# Patient Record
Sex: Female | Born: 1955 | Race: White | Hispanic: No | Marital: Single | Smoking: Never smoker
Health system: Southern US, Community
[De-identification: ages and names within clinical notes are randomized; demographics above are authoritative.]

## PROBLEM LIST (undated history)

## (undated) DIAGNOSIS — F70 Mild intellectual disabilities: Secondary | ICD-10-CM

## (undated) DIAGNOSIS — E119 Type 2 diabetes mellitus without complications: Secondary | ICD-10-CM

## (undated) DIAGNOSIS — I10 Essential (primary) hypertension: Secondary | ICD-10-CM

## (undated) DIAGNOSIS — Z87820 Personal history of traumatic brain injury: Secondary | ICD-10-CM

## (undated) DIAGNOSIS — M1990 Unspecified osteoarthritis, unspecified site: Secondary | ICD-10-CM

## (undated) HISTORY — DX: Essential (primary) hypertension: I10

## (undated) HISTORY — DX: Personal history of traumatic brain injury: Z87.820

## (undated) HISTORY — PX: ABDOMINAL HYSTERECTOMY: SHX81

## (undated) HISTORY — DX: Unspecified osteoarthritis, unspecified site: M19.90

## (undated) HISTORY — PX: KNEE SURGERY: SHX244

## (undated) HISTORY — DX: Mild intellectual disabilities: F70

## (undated) HISTORY — PX: TONSILLECTOMY: SHX5217

## (undated) HISTORY — DX: Type 2 diabetes mellitus without complications: E11.9

---

## 2020-10-21 ENCOUNTER — Other Ambulatory Visit: Payer: Self-pay | Admitting: Family Medicine

## 2020-10-21 DIAGNOSIS — Z1231 Encounter for screening mammogram for malignant neoplasm of breast: Secondary | ICD-10-CM

## 2020-10-29 ENCOUNTER — Ambulatory Visit
Admission: RE | Admit: 2020-10-29 | Discharge: 2020-10-29 | Disposition: A | Discharge status: Home / Self Care | Payer: Medicare HMO | Source: Ambulatory Visit | Attending: Family Medicine | Admitting: Family Medicine

## 2020-10-29 ENCOUNTER — Other Ambulatory Visit: Payer: Self-pay | Admitting: Family Medicine

## 2020-10-29 ENCOUNTER — Other Ambulatory Visit: Payer: Self-pay

## 2020-10-29 DIAGNOSIS — T1490XA Injury, unspecified, initial encounter: Secondary | ICD-10-CM

## 2020-12-28 ENCOUNTER — Ambulatory Visit
Admission: RE | Admit: 2020-12-28 | Discharge: 2020-12-28 | Disposition: A | Discharge status: Home / Self Care | Payer: Medicare HMO | Source: Ambulatory Visit | Attending: Family Medicine | Admitting: Family Medicine

## 2020-12-28 ENCOUNTER — Other Ambulatory Visit: Payer: Self-pay

## 2020-12-28 DIAGNOSIS — Z1231 Encounter for screening mammogram for malignant neoplasm of breast: Secondary | ICD-10-CM

## 2021-03-18 ENCOUNTER — Encounter: Payer: Medicare HMO | Admitting: Obstetrics and Gynecology

## 2021-04-29 ENCOUNTER — Encounter: Payer: Medicare HMO | Admitting: Obstetrics and Gynecology

## 2021-07-14 ENCOUNTER — Ambulatory Visit (INDEPENDENT_AMBULATORY_CARE_PROVIDER_SITE_OTHER): Payer: Medicare HMO | Admitting: Obstetrics and Gynecology

## 2021-07-14 ENCOUNTER — Encounter: Payer: Self-pay | Admitting: Obstetrics and Gynecology

## 2021-07-14 ENCOUNTER — Other Ambulatory Visit: Payer: Self-pay

## 2021-07-14 VITALS — BP 132/68 | HR 72

## 2021-07-14 DIAGNOSIS — Z01419 Encounter for gynecological examination (general) (routine) without abnormal findings: Secondary | ICD-10-CM | POA: Diagnosis not present

## 2021-07-14 DIAGNOSIS — N3946 Mixed incontinence: Secondary | ICD-10-CM

## 2021-07-14 NOTE — Progress Notes (Signed)
65 y.o. No obstetric history on file. Single White or Caucasian Not Hispanic or Latino female here for breast and pelvic exam.      She has mixed incontinence. It has gotten worse in the last year. She has gone from not wearing pads to needing pads.  No LMP recorded.          Sexually active: No.  The current method of family planning is status post hysterectomy.    Exercising: No.  The patient does not participate in regular exercise at present. Smoker:  no  Health Maintenance: Pap:  unsure patient has hysterectomy  History of abnormal Pap:  no MMG:  this year normal per patient done at [facility] will request  BMD:   unsure  Colonoscopy: none  TDaP:  up to date  Gardasil: n/a   reports that she has never smoked. She has never used smokeless tobacco. She reports that she does not currently use alcohol. She reports that she does not use drugs. Lives in a group home.   Past Medical History:  Diagnosis Date   Arthritis    Diabetes mellitus without complication (HCC)    Hypertension    Mild intellectual disabilities    Personal history of traumatic brain injury   Larey Seat of the back porch, when she was child.  She is in a wheelchair, has knee issues and has been falling.   Past Surgical History:  Procedure Laterality Date   ABDOMINAL HYSTERECTOMY     KNEE SURGERY Bilateral    TONSILLECTOMY      Current Outpatient Medications  Medication Sig Dispense Refill   atorvastatin (LIPITOR) 40 MG tablet Take 40 mg by mouth daily.     diazepam (VALIUM) 5 MG tablet Take 5 mg by mouth every 6 (six) hours as needed for anxiety.     gabapentin (NEURONTIN) 100 MG capsule Take 100 mg by mouth 3 (three) times daily.     metFORMIN (GLUCOPHAGE) 500 MG tablet Take 500 mg by mouth 2 (two) times daily.     MYRBETRIQ 25 MG TB24 tablet Take 25 mg by mouth daily.     Promethazine HCl (PHENERGAN PO) Take by mouth.     sertraline (ZOLOFT) 25 MG tablet Take 25 mg by mouth daily.     No current  facility-administered medications for this visit.    Family History  Problem Relation Age of Onset   Breast cancer Maternal Aunt     Review of Systems  All other systems reviewed and are negative.  Exam:   BP 132/68    Pulse 72    SpO2 99%   Weight change: @WEIGHTCHANGE @ Height:      Ht Readings from Last 3 Encounters:  No data found for Ht    General appearance: alert, cooperative and appears stated age Head: Normocephalic, without obvious abnormality, atraumatic Neck: no adenopathy, supple, symmetrical, trachea midline and thyroid normal to inspection and palpation Breasts: normal appearance, no masses or tenderness Abdomen: soft, non-tender; non distended,  no masses,  no organomegaly Extremities: extremities normal, atraumatic, no cyanosis or edema Skin: Skin color, texture, turgor normal. No rashes or lesions Lymph nodes: Cervical, supraclavicular, and axillary nodes normal. No abnormal inguinal nodes palpated Neurologic: Grossly normal   Pelvic: External genitalia:  no lesions              Urethra:  normal appearing urethra with no masses, tenderness or lesions              Bartholins and  Skenes: normal                 Vagina: atrophic appearing vagina, tight at the apex. No lesions              Cervix: absent               Bimanual Exam:  Uterus:  uterus absent              Adnexa: no mass, fullness, tenderness               Rectovaginal: Confirms               Anus:  normal sphincter tone, no lesions  [name] chaperoned for the exam.  1. Encounter for gynecological examination without abnormal finding Mammogram UTD, will get results She needs to schedule colon cancer screening with her primary Labs with primary  2. Mixed incontinence Worsening  - Urinalysis,Complete w/RFL Culture

## 2021-07-15 NOTE — Addendum Note (Signed)
Addended by: Rushie Goltz on: 07/15/2021 03:40 PM   Modules accepted: Orders

## 2021-07-21 ENCOUNTER — Other Ambulatory Visit: Payer: Self-pay | Admitting: Family Medicine

## 2021-07-21 DIAGNOSIS — Z1231 Encounter for screening mammogram for malignant neoplasm of breast: Secondary | ICD-10-CM

## 2021-11-09 ENCOUNTER — Ambulatory Visit (INDEPENDENT_AMBULATORY_CARE_PROVIDER_SITE_OTHER): Payer: Medicare HMO | Admitting: Internal Medicine

## 2021-11-09 ENCOUNTER — Encounter: Payer: Self-pay | Admitting: Internal Medicine

## 2021-11-09 VITALS — BP 120/80 | HR 84 | Ht 66.0 in | Wt 180.4 lb

## 2021-11-09 DIAGNOSIS — E785 Hyperlipidemia, unspecified: Secondary | ICD-10-CM

## 2021-11-09 DIAGNOSIS — R739 Hyperglycemia, unspecified: Secondary | ICD-10-CM

## 2021-11-09 DIAGNOSIS — E119 Type 2 diabetes mellitus without complications: Secondary | ICD-10-CM

## 2021-11-09 LAB — POCT GLYCOSYLATED HEMOGLOBIN (HGB A1C): Hemoglobin A1C: 6.3 % — AB (ref 4.0–5.6)

## 2021-11-09 LAB — LDL CHOLESTEROL, DIRECT: Direct LDL: 56 mg/dL

## 2021-11-09 LAB — LIPID PANEL
Cholesterol: 118 mg/dL (ref 0–200)
HDL: 34.8 mg/dL — ABNORMAL LOW (ref >39.00)
NonHDL: 82.76
Total CHOL/HDL Ratio: 3
Triglycerides: 255 mg/dL — ABNORMAL HIGH (ref 0.0–149.0)
VLDL: 51 mg/dL — ABNORMAL HIGH (ref 0.0–40.0)

## 2021-11-09 LAB — T4, FREE: Free T4: 0.72 ng/dL (ref 0.60–1.60)

## 2021-11-09 LAB — BASIC METABOLIC PANEL
BUN: 20 mg/dL (ref 6–23)
CO2: 29 mEq/L (ref 19–32)
Calcium: 9.4 mg/dL (ref 8.4–10.5)
Chloride: 99 mEq/L (ref 96–112)
Creatinine, Ser: 0.75 mg/dL (ref 0.40–1.20)
GFR: 84.79 mL/min (ref >60.00)
Glucose, Bld: 89 mg/dL (ref 70–99)
Potassium: 4.4 mEq/L (ref 3.5–5.1)
Sodium: 136 mEq/L (ref 135–145)

## 2021-11-09 LAB — TSH: TSH: 1.52 u[IU]/mL (ref 0.35–5.50)

## 2021-11-09 LAB — POCT GLUCOSE (DEVICE FOR HOME USE): Glucose Fasting, POC: 94 mg/dL (ref 70–99)

## 2021-11-09 LAB — MICROALBUMIN / CREATININE URINE RATIO
Creatinine,U: 42.3 mg/dL
Microalb Creat Ratio: 2.1 mg/g (ref 0.0–30.0)
Microalb, Ur: 0.9 mg/dL (ref 0.0–1.9)

## 2021-11-09 MED ORDER — METFORMIN HCL ER 500 MG PO TB24: 500.0000 mg | ORAL_TABLET | Freq: Two times a day (BID) | ORAL | 3 refills | Status: DC

## 2021-11-09 NOTE — Progress Notes (Signed)
?Name: Catherine Levy  ?MRN/ DOB: [id number], 1956-01-20   ?Age/ Sex: 66 y.o., female   ? ?PCP: Pcp, No   ?Reason for Endocrinology Evaluation: Type 2 Diabetes Mellitus  ?   ?Date of Initial Endocrinology Visit: 11/09/2021   ? ? ?PATIENT IDENTIFIER: [name] is a 67 y.o. female with a past medical history of T2DM, HTN, mild intellectual disabilities. The patient presented for initial endocrinology clinic visit on 11/09/2021 for consultative assistance with her diabetes management.  ? ? ?HPI: ?[name] is accompanied by a caretaker from the facility ? ? ?Diagnosed with DM years ago ?Prior Medications tried/Intolerance: metformin  ?Currently checking blood sugars 2 x / day ?Hemoglobin A1c 6.3% on initial presentation to our clinic ?Patient required assistance for hypoglycemia: no ?Patient has required hospitalization within the last 1 year from hyper or hypoglycemia: no ? ?In terms of diet, the patient eats 3 meals a day, snacks graham crackers and sugar-free cookies . Drinks water and diet drinks  ? ? ?HOME DIABETES REGIMEN: ?Metformin 500 mg twice daily ? ? ?Statin: Yes ?ACE-I/ARB: No ? ? ? ?METER DOWNLOAD SUMMARY: not available  ? ? ? ?DIABETIC COMPLICATIONS: ?Microvascular complications:  ?Left eye cataract  ?Denies: CKD , neuropathy , retinopathy  ?Last eye exam: Completed 2022 ? ?Macrovascular complications:  ? ?Denies: CAD, PVD, CVA ? ? ?PAST HISTORY: ?Past Medical History:  ?Past Medical History:  ?Diagnosis Date  ? Arthritis   ? Diabetes mellitus without complication (HCC)   ? Hypertension   ? Mild intellectual disabilities   ? Personal history of traumatic brain injury   ? ?Past Surgical History:  ?Past Surgical History:  ?Procedure Laterality Date  ? ABDOMINAL HYSTERECTOMY    ? KNEE SURGERY Bilateral   ? TONSILLECTOMY    ?  ?Social History:  reports that she has never smoked. She has never used smokeless tobacco. She reports that she does not currently use alcohol. She reports that she does not  use drugs. ?Family History:  ?Family History  ?Problem Relation Age of Onset  ? Breast cancer Maternal Aunt   ? ? ? ?HOME MEDICATIONS: ?Allergies as of 11/09/2021   ?No Known Allergies ?  ? ?  ?Medication List  ?  ? ?  ? Accurate as of November 09, 2021 10:39 AM. If you have any questions, ask your nurse or doctor.  ?  ?  ? ?  ? ?acetaminophen 325 MG tablet ?Commonly known as: TYLENOL ?Take 650 mg by mouth every 6 (six) hours as needed. ?  ?atorvastatin 40 MG tablet ?Commonly known as: LIPITOR ?Take 40 mg by mouth daily. ?  ?Balmex Multi-Purpose Oint ?Apply topically. ?  ?brompheniramine-phenylephrine 2-5 MG/10ML Liqd ?Commonly known as: DIMETAPP ?Take by mouth. ?  ?calcium carbonate 600 MG Tabs tablet ?Commonly known as: OS-CAL ?Take 600 mg by mouth 2 (two) times daily with a meal. ?  ?diazepam 5 MG tablet ?Commonly known as: VALIUM ?Take 5 mg by mouth every 6 (six) hours as needed for anxiety. ?  ?Dulcolax 10 MG suppository ?Generic drug: bisacodyl ?Place 10 mg rectally as needed for moderate constipation. ?  ?gabapentin 100 MG capsule ?Commonly known as: NEURONTIN ?Take 100 mg by mouth 3 (three) times daily. ?  ?lisinopril 20 MG tablet ?Commonly known as: ZESTRIL ?Take 20 mg by mouth daily. ?  ?loperamide 2 MG capsule ?Commonly known as: IMODIUM ?Take 2 mg by mouth as needed for diarrhea or loose stools. ?  ?metFORMIN 500 MG 24 hr tablet ?Commonly  known as: GLUCOPHAGE-XR ?Take 500 mg by mouth daily. ?What changed: Another medication with the same name was removed. Continue taking this medication, and follow the directions you see here. ?Changed by: Scarlette Shorts, MD ?  ?Mintox 200-200-20 MG/5ML suspension ?Generic drug: alum & mag hydroxide-simeth ?Take by mouth every 6 (six) hours as needed for indigestion or heartburn. ?  ?Myrbetriq 25 MG Tb24 tablet ?Generic drug: mirabegron ER ?Take 25 mg by mouth daily. ?  ?Omega-3 1000 MG Caps ?Take 1 capsule by mouth daily. ?  ?PHENERGAN PO ?Take by mouth. ?   ?sertraline 25 MG tablet ?Commonly known as: ZOLOFT ?Take 25 mg by mouth daily. ?  ?vitamin A & D ointment ?Apply 1 application. topically as needed for dry skin. ?  ?vitamin C 1000 MG tablet ?Take 1,000 mg by mouth daily. ?  ?Vitamin D (Cholecalciferol) 25 MCG (1000 UT) Tabs ?Take 2,000 Units by mouth daily. ?  ?ZyrTEC Allergy 10 MG Caps ?Generic drug: Cetirizine HCl ?Take 10 mg by mouth daily at 6 (six) AM. ?  ? ?  ? ? ? ?ALLERGIES: ?No Known Allergies ? ? ?REVIEW OF SYSTEMS: ?A comprehensive ROS was conducted with the patient and is negative except as per HPI and below:  ?Review of Systems  ?Eyes:  Negative for blurred vision.  ?Gastrointestinal:  Negative for diarrhea, nausea and vomiting.  ? ?  ?OBJECTIVE:  ? ?VITAL SIGNS: BP 120/80 (BP Location: Left Arm, Patient Position: Sitting, Cuff Size: Small)   Pulse 84   Ht  (1.676 m)   Wt 180 lb 6.4 oz (81.8 kg)   SpO2 94%   BMI 29.12 kg/m?   ? ?PHYSICAL EXAM:  ?General: Pt appears well and is in NAD  ?Neck: General: Supple without adenopathy or carotid bruits. ?Thyroid: Thyroid size normal.  No goiter or nodules appreciated. No thyroid bruit.  ?Lungs: Clear with good BS bilat with no rales, rhonchi, or wheezes  ?Heart: RRR with normal S1 and S2 and no gallops; no murmurs; no rub  ?Extremities:  ?Lower extremities - No pretibial edema, patient with bilateral knee flexion deformity  ?Neuro: MS is good with appropriate affect, pt is alert and Ox3  ? ? ?DM foot exam: 11/09/2021 ? ?The skin of the feet is intact without sores or ulcerations. ?The pedal pulses are 2+ on right and 2+ on left. ?The sensation is intact to a screening 5.07, 10 gram monofilament bilaterally ? ? ?DATA REVIEWED: ? ?Lab Results  ?Component Value Date  ? HGBA1C 6.3 (A) 11/09/2021  ? ? Latest Reference Range & Units 11/09/21 11:15  ?Sodium 135 - 145 mEq/L 136  ?Potassium 3.5 - 5.1 mEq/L 4.4  ?Chloride 96 - 112 mEq/L 99  ?CO2 19 - 32 mEq/L 29  ?Glucose 70 - 99 mg/dL 89  ?BUN 6 - 23 mg/dL 20   ?Creatinine 0.40 - 1.20 mg/dL 1.61  ?Calcium 8.4 - 10.5 mg/dL 9.4  ?GFR >60.00 mL/min 84.79  ?Total CHOL/HDL Ratio  3  ?Cholesterol 0 - 200 mg/dL 096  ?HDL Cholesterol >39.00 mg/dL 04.54 (L)  ?Direct LDL mg/dL 09.8  ?MICROALB/CREAT RATIO 0.0 - 30.0 mg/g 2.1  ?NonHDL  82.76  ?Triglycerides 0.0 - 149.0 mg/dL 119.1 (H)  ?VLDL 0.0 - 40.0 mg/dL 47.8 (H)  ?TSH 0.35 - 5.50 uIU/mL 1.52  ?T4,Free(Direct) 0.60 - 1.60 ng/dL 2.95  ? ? ?ASSESSMENT / PLAN / RECOMMENDATIONS:  ? ?1) Type 2 Diabetes Mellitus, optimally controlled, With out complications - Most recent A1c of 6.3%.  Goal A1c <7.0%.   ? 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  The patient does sometimes consume sugar sweetened products at the facility ?-I understand that this is difficult to control in a group home but I have asked the patient to be mindful as much as possible with her eating habits, for example today they have a birthday celebration and they will be serving ice cream, cupcakes, and pizza ?-Caregiver is asking what they should do if the patient has hyperglycemia >180 mg/dL I explained to her that since the patient is not on insulin the only thing that can be done is to hydrate as much as possible and avoid eating until the glucose readings trend down usually within 3-4 hours ?-No changes at this time to her metformin ? ?MEDICATIONS: ?Continue metformin 500 mg twice daily ? ?EDUCATION / INSTRUCTIONS: ?BG monitoring instructions: Patient is instructed to check her blood sugars 2-3 times a week ? ?2) Diabetic complications:  ?Eye: Does not have known diabetic retinopathy.  ?Neuro/ Feet: Does not have known diabetic peripheral neuropathy. ?Renal: Patient does not have known baseline CKD. She is not on an ACEI/ARB at present. ? ? ?3)Dyslipidemia: ? ?-LDL at goal but TG is elevated ?-We will continue to monitor,  no change ? ?Continue atorvastatin 40 mg daily ? ? ? ?Signed electronically by: ?Abby Nena Jordan, MD ? ?[facility]  ?[facility] ?[address] ?[place], [place] [address]

## 2021-11-09 NOTE — Patient Instructions (Signed)
Continue Metformin 500 mg twice daily.

## 2021-11-10 DIAGNOSIS — E785 Hyperlipidemia, unspecified: Secondary | ICD-10-CM | POA: Insufficient documentation

## 2021-11-10 DIAGNOSIS — E119 Type 2 diabetes mellitus without complications: Secondary | ICD-10-CM | POA: Insufficient documentation

## 2022-01-03 ENCOUNTER — Encounter: Payer: Self-pay | Admitting: Radiology

## 2022-01-03 ENCOUNTER — Ambulatory Visit
Admission: RE | Admit: 2022-01-03 | Discharge: 2022-01-03 | Disposition: A | Discharge status: Home / Self Care | Payer: Medicare HMO | Source: Ambulatory Visit | Attending: Family Medicine | Admitting: Family Medicine

## 2022-01-03 DIAGNOSIS — Z1231 Encounter for screening mammogram for malignant neoplasm of breast: Secondary | ICD-10-CM

## 2022-05-09 ENCOUNTER — Ambulatory Visit: Payer: Medicare HMO | Admitting: Internal Medicine

## 2022-05-09 NOTE — Progress Notes (Deleted)
Name: Catherine Levy  MRN/ DOB: [id number], 01/09/1956   Age/ Sex: 66 y.o., female    PCP: Trinidad Curet   Reason for Endocrinology Evaluation: Type 2 Diabetes Mellitus     Date of Initial Endocrinology Visit: 11/09/2021    PATIENT IDENTIFIER: [name] is a 66 y.o. female with a past medical history of T2DM, HTN, mild intellectual disabilities. The patient presented for initial endocrinology clinic visit on 11/09/2021  for consultative assistance with her diabetes management.    HPI: [name] is accompanied by a caretaker from the facility   Diagnosed with DM years ago Prior Medications tried/Intolerance: metformin  Hemoglobin A1c 6.3% on initial presentation to our clinic  On her initial visit to our clinic her A1c was 6.3%, we continued metformin   SUBJECTIVE:   During the last visit (11/09/2021): A1c 6.3%  Today (05/09/22): Catherine Levy is here for follow-up on diabetes management.  She checks her blood sugars *** times daily. The patient has *** had hypoglycemic episodes since the last clinic visit, which typically occur *** x / - most often occuring ***. The patient is *** symptomatic with these episodes, with symptoms of {symptoms; hypoglycemia:[id number]}.      HOME DIABETES REGIMEN: Metformin 500 mg twice daily   Statin: Yes ACE-I/ARB: No    METER DOWNLOAD SUMMARY: not available     DIABETIC COMPLICATIONS: Microvascular complications:  Left eye cataract  Denies: CKD , neuropathy , retinopathy  Last eye exam: Completed 2022  Macrovascular complications:   Denies: CAD, PVD, CVA   PAST HISTORY: Past Medical History:  Past Medical History:  Diagnosis Date   Arthritis    Diabetes mellitus without complication ([place])    Hypertension    Mild intellectual disabilities    Personal history of traumatic brain injury    Past Surgical History:  Past Surgical History:  Procedure Laterality Date   ABDOMINAL HYSTERECTOMY     KNEE SURGERY  Bilateral    TONSILLECTOMY      Social History:  reports that she has never smoked. She has never used smokeless tobacco. She reports that she does not currently use alcohol. She reports that she does not use drugs. Family History:  Family History  Problem Relation Age of Onset   Breast cancer Maternal Aunt      HOME MEDICATIONS: Allergies as of 05/09/2022   No Known Allergies      Medication List        Accurate as of May 09, 2022  7:27 AM. If you have any questions, ask your nurse or doctor.          acetaminophen 325 MG tablet Commonly known as: TYLENOL Take 650 mg by mouth every 6 (six) hours as needed.   atorvastatin 40 MG tablet Commonly known as: LIPITOR Take 40 mg by mouth daily.   Balmex Multi-Purpose Oint Apply topically.   BENADRYL CHILDRENS ALLERGY 12.5 MG/5ML liquid Generic drug: diphenhydrAMINE Take by mouth 4 (four) times daily as needed.   brompheniramine-phenylephrine 2-5 MG/10ML Liqd Commonly known as: DIMETAPP Take by mouth.   calcium carbonate 600 MG Tabs tablet Commonly known as: OS-CAL Take 600 mg by mouth 2 (two) times daily with a meal.   carbamide peroxide 6.5 % OTIC solution Commonly known as: DEBROX 5 drops 2 (two) times daily.   diazepam 5 MG tablet Commonly known as: VALIUM Take 5 mg by mouth every 6 (six) hours as needed for anxiety.   Dulcolax 10 MG suppository Generic drug: bisacodyl Place  10 mg rectally as needed for moderate constipation.   gabapentin 100 MG capsule Commonly known as: NEURONTIN Take 100 mg by mouth 3 (three) times daily.   ibuprofen 200 MG tablet Commonly known as: ADVIL Take 200 mg by mouth every 6 (six) hours as needed.   lisinopril 20 MG tablet Commonly known as: ZESTRIL Take 20 mg by mouth daily.   loperamide 2 MG capsule Commonly known as: IMODIUM Take 2 mg by mouth as needed for diarrhea or loose stools.   metFORMIN 500 MG 24 hr tablet Commonly known as: GLUCOPHAGE-XR Take 1  tablet (500 mg total) by mouth in the morning and at bedtime.   Milk of Magnesia 400 MG/5ML suspension Generic drug: magnesium hydroxide Take by mouth daily as needed for mild constipation.   Mintox 200-200-20 MG/5ML suspension Generic drug: alum & mag hydroxide-simeth Take by mouth every 6 (six) hours as needed for indigestion or heartburn.   Myrbetriq 25 MG Tb24 tablet Generic drug: mirabegron ER Take 25 mg by mouth daily.   neomycin-bacitracin-polymyxin [phone] ointment Apply topically 4 (four) times daily.   Omega-3 1000 MG Caps Take 1 capsule by mouth daily.   PHENERGAN PO Take 25 mg by mouth.   sertraline 25 MG tablet Commonly known as: ZOLOFT Take 25 mg by mouth daily.   Sunscreen Ultra Sheer Lotn Apply topically.   vitamin A & D ointment Apply 1 application. topically as needed for dry skin.   vitamin C 1000 MG tablet Take 1,000 mg by mouth daily.   Vitamin D (Cholecalciferol) 25 MCG (1000 UT) Tabs Take 2,000 Units by mouth daily.   ZyrTEC Allergy 10 MG Caps Generic drug: Cetirizine HCl Take 10 mg by mouth daily at 6 (six) AM.         ALLERGIES: No Known Allergies     OBJECTIVE:   VITAL SIGNS: There were no vitals taken for this visit.   PHYSICAL EXAM:  General: Pt appears well and is in NAD  Neck: General: Supple without adenopathy or carotid bruits. Thyroid: Thyroid size normal.  No goiter or nodules appreciated. No thyroid bruit.  Lungs: Clear with good BS bilat with no rales, rhonchi, or wheezes  Heart: RRR with normal S1 and S2 and no gallops; no murmurs; no rub  Extremities:  Lower extremities - No pretibial edema, patient with bilateral knee flexion deformity  Neuro: MS is good with appropriate affect, pt is alert and Ox3    DM foot exam: 11/09/2021  The skin of the feet is intact without sores or ulcerations. The pedal pulses are 2+ on right and 2+ on left. The sensation is intact to a screening 5.07, 10 gram monofilament  bilaterally   DATA REVIEWED:  Lab Results  Component Value Date   HGBA1C 6.3 (A) 11/09/2021    Latest Reference Range & Units 11/09/21 11:15  Sodium 135 - 145 mEq/L 136  Potassium 3.5 - 5.1 mEq/L 4.4  Chloride 96 - 112 mEq/L 99  CO2 19 - 32 mEq/L 29  Glucose 70 - 99 mg/dL 89  BUN 6 - 23 mg/dL 20  Creatinine 0.40 - 1.20 mg/dL 0.75  Calcium 8.4 - 10.5 mg/dL 9.4  GFR >60.00 mL/min 84.79  Total CHOL/HDL Ratio  3  Cholesterol 0 - 200 mg/dL 118  HDL Cholesterol >39.00 mg/dL 34.80 (L)  Direct LDL mg/dL 56.0  MICROALB/CREAT RATIO 0.0 - 30.0 mg/g 2.1  NonHDL  82.76  Triglycerides 0.0 - 149.0 mg/dL 255.0 (H)  VLDL 0.0 - 40.0 mg/dL  51.0 (H)  TSH 0.35 - 5.50 uIU/mL 1.52  T4,Free(Direct) 0.60 - 1.60 ng/dL 0.72    ASSESSMENT / PLAN / RECOMMENDATIONS:   1) Type 2 Diabetes Mellitus, optimally controlled, With out complications - Most recent A1c of 6.3%. Goal A1c <7.0%.    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  The patient does sometimes consume sugar sweetened products at the facility -I understand that this is difficult to control in a group home but I have asked the patient to be mindful as much as possible with her eating habits, for example today they have a birthday celebration and they will be serving ice cream, cupcakes, and pizza -Caregiver is asking what they should do if the patient has hyperglycemia >180 mg/dL I explained to her that since the patient is not on insulin the only thing that can be done is to hydrate as much as possible and avoid eating until the glucose readings trend down usually within 3-4 hours -No changes at this time to her metformin  MEDICATIONS: Continue metformin 500 mg twice daily  EDUCATION / INSTRUCTIONS: BG monitoring instructions: Patient is instructed to check her blood sugars 2-3 times a week  2)  Diabetic complications:  Eye: Does not have known diabetic retinopathy.  Neuro/ Feet: Does not have known diabetic peripheral neuropathy. Renal: Patient does not have known baseline CKD. She is not on an ACEI/ARB at present.   3)Dyslipidemia:  -LDL at goal but TG is elevated -We will continue to monitor, no change  Continue atorvastatin 40 mg daily    Signed electronically by: Mack Guise, MD  [facility]  [facility] [address] [place], [place] [address] Phone: [phone] FAX: [phone]   CC: Trinidad Curet [address] [place] [address] Phone: [phone]  Fax: [phone]    Return to Endocrinology clinic as below: Future Appointments  Date Time Provider [place]  05/09/2022 10:10 AM Rosalind Guido, Melanie Crazier, MD [id number] None

## 2022-08-19 ENCOUNTER — Other Ambulatory Visit: Payer: Self-pay

## 2022-08-19 DIAGNOSIS — E119 Type 2 diabetes mellitus without complications: Secondary | ICD-10-CM

## 2022-08-19 MED ORDER — METFORMIN HCL ER 500 MG PO TB24: 500.0000 mg | ORAL_TABLET | Freq: Two times a day (BID) | ORAL | 0 refills | Status: AC

## 2022-08-23 NOTE — Progress Notes (Signed)
67 y.o. G0P0000 Single White or Caucasian Not Hispanic or Latino female here for annual exam.  H/O hysterectomy. No vaginal bleeding. She has some incontinence at night. Not getting worse.  No bowel c/o.     No LMP recorded. Patient is postmenopausal.          Sexually active: No.  The current method of family planning is post menopausal status.    Exercising: Yes.     Tries to walk  Smoker:  no  Health Maintenance: Pap:  patient is unsure she had had a hysterectomy  History of abnormal Pap:  no MMG:  01/04/22 density B Bi-rads 1 neg  BMD:   none  Colonoscopy: years ago  TDaP:  unsure, will check with her primary.  Gardasil: n/a   reports that she has never smoked. She has never used smokeless tobacco. She reports that she does not currently use alcohol. She reports that she does not use drugs. Lives in a group home.   Past Medical History:  Diagnosis Date   Arthritis    Diabetes mellitus without complication ([place])    Hypertension    Mild intellectual disabilities    Personal history of traumatic brain injury     Past Surgical History:  Procedure Laterality Date   ABDOMINAL HYSTERECTOMY     KNEE SURGERY Bilateral    TONSILLECTOMY      Current Outpatient Medications  Medication Sig Dispense Refill   acetaminophen (TYLENOL) 325 MG tablet Take 650 mg by mouth every 6 (six) hours as needed.     alum & mag hydroxide-simeth ([place]men[place]) 200-200-20 MG/5ML suspension Take by mouth every 6 (six) hours as needed for indigestion or heartburn.     Ascorbic Acid (VITAMIN C) 1000 MG tablet Take 1,000 mg by mouth daily.     atorvastatin (LIPITOR) 40 MG tablet Take 40 mg by mouth daily.     bisacodyl (DULCOLAX) 10 MG suppository Place 10 mg rectally as needed for moderate constipation.     brompheniramine-phenylephrine (DIMETAPP) 2-5 MG/10ML LIQD Take by mouth.     calcium carbonate (OS-CAL) 600 MG TABS tablet Take 600 mg by mouth 2 (two) times daily with a meal.     carbamide peroxide  (DEBROX) 6.5 % OTIC solution 5 drops 2 (two) times daily.     Cetirizine HCl (ZYRTEC ALLERGY) 10 MG CAPS Take 10 mg by mouth daily at 6 (six) AM.     Diaper Rash Products (BALMEX MULTI-PURPOSE) OINT Apply topically.     diazepam (VALIUM) 5 MG tablet Take 5 mg by mouth every 6 (six) hours as needed for anxiety.     diphenhydrAMINE (BENADRYL CHILDRENS ALLERGY) 12.5 MG/5ML liquid Take by mouth 4 (four) times daily as needed.     gabapentin (NEURONTIN) 100 MG capsule Take 100 mg by mouth 3 (three) times daily.     ibuprofen (ADVIL) 200 MG tablet Take 200 mg by mouth every 6 (six) hours as needed.     lisinopril (ZESTRIL) 20 MG tablet Take 20 mg by mouth daily.     loperamide (IMODIUM) 2 MG capsule Take 2 mg by mouth as needed for diarrhea or loose stools.     magnesium hydroxide (MILK OF MAGNESIA) 400 MG/5ML suspension Take by mouth daily as needed for mild constipation.     metFORMIN (GLUCOPHAGE-XR) 500 MG 24 hr tablet Take 1 tablet (500 mg total) by mouth in the morning and at bedtime. 60 tablet 0   MYRBETRIQ 25 MG TB24 tablet Take 25 mg  by mouth daily.     neomycin-bacitracin-polymyxin (NEOSPORIN) [phone] ointment Apply topically 4 (four) times daily.     Omega-3 1000 MG CAPS Take 1 capsule by mouth daily.     Promethazine HCl (PHENERGAN PO) Take 25 mg by mouth.     sertraline (ZOLOFT) 25 MG tablet Take 25 mg by mouth daily.     Sunscreens (SUNSCREEN ULTRA SHEER) LOTN Apply topically.     Vitamin D, Cholecalciferol, 25 MCG (1000 UT) TABS Take 2,000 Units by mouth daily.     Vitamins A & D (VITAMIN A & D) ointment Apply 1 application. topically as needed for dry skin.     No current facility-administered medications for this visit.    Family History  Problem Relation Age of Onset   Breast cancer Maternal Aunt     Review of Systems  All other systems reviewed and are negative.   Exam:   There were no vitals taken for this visit.  Weight change: @WEIGHTCHANGE$ @ Height:      Ht  Readings from Last 3 Encounters:  11/09/21 5' 6"$  (1.676 m)    General appearance: alert, cooperative and appears stated age Head: Normocephalic, without obvious abnormality, atraumatic Neck: no adenopathy, supple, symmetrical, trachea midline and thyroid normal to inspection and palpation Breasts: normal appearance, no masses or tenderness Abdomen: soft, non-tender; non distended,  no masses,  no organomegaly Extremities: extremities normal, atraumatic, no cyanosis or edema Skin: Skin color, texture, turgor normal. No rashes or lesions Lymph nodes: Cervical, supraclavicular, and axillary nodes normal. No abnormal inguinal nodes palpated Neurologic: Grossly normal   Pelvic: External genitalia:  no lesions              Urethra:  normal appearing urethra with no masses, tenderness or lesions              Bartholins and Skenes: normal                 Vagina: atrophic appearing vagina, very narrow at the apex              Cervix: absent               Bimanual Exam:  Uterus:  uterus absent              Adnexa: no mass, fullness, tenderness               Rectovaginal: Confirms               Anus:  normal sphincter tone, no lesions  Kimalexis, RMA chaperoned for the exam.  1. Encounter for breast and pelvic examination No pap needed Mammogram due in 6/24 Labs with primary She will discuss colonoscopy with her primary

## 2022-08-30 ENCOUNTER — Ambulatory Visit (INDEPENDENT_AMBULATORY_CARE_PROVIDER_SITE_OTHER): Payer: Medicare HMO | Admitting: Obstetrics and Gynecology

## 2022-08-30 ENCOUNTER — Encounter: Payer: Self-pay | Admitting: Obstetrics and Gynecology

## 2022-08-30 VITALS — BP 120/82 | HR 70

## 2022-08-30 DIAGNOSIS — Z01419 Encounter for gynecological examination (general) (routine) without abnormal findings: Secondary | ICD-10-CM

## 2022-11-24 ENCOUNTER — Other Ambulatory Visit: Payer: Self-pay | Admitting: Family Medicine

## 2022-11-24 DIAGNOSIS — Z1231 Encounter for screening mammogram for malignant neoplasm of breast: Secondary | ICD-10-CM

## 2022-12-25 IMAGING — MG MM DIGITAL SCREENING BILAT W/ TOMO AND CAD
8 series · 8 of 24 positions shown · non-contrast
Comparison: None.

CLINICAL DATA: Screening.

EXAM:
DIGITAL SCREENING BILATERAL MAMMOGRAM WITH TOMOSYNTHESIS AND CAD
TECHNIQUE: Bilateral screening digital craniocaudal and mediolateral oblique
mammograms were obtained. Bilateral screening digital breast
tomosynthesis was performed. The images were evaluated with
computer-aided detection.

[L MLO synth-2D]
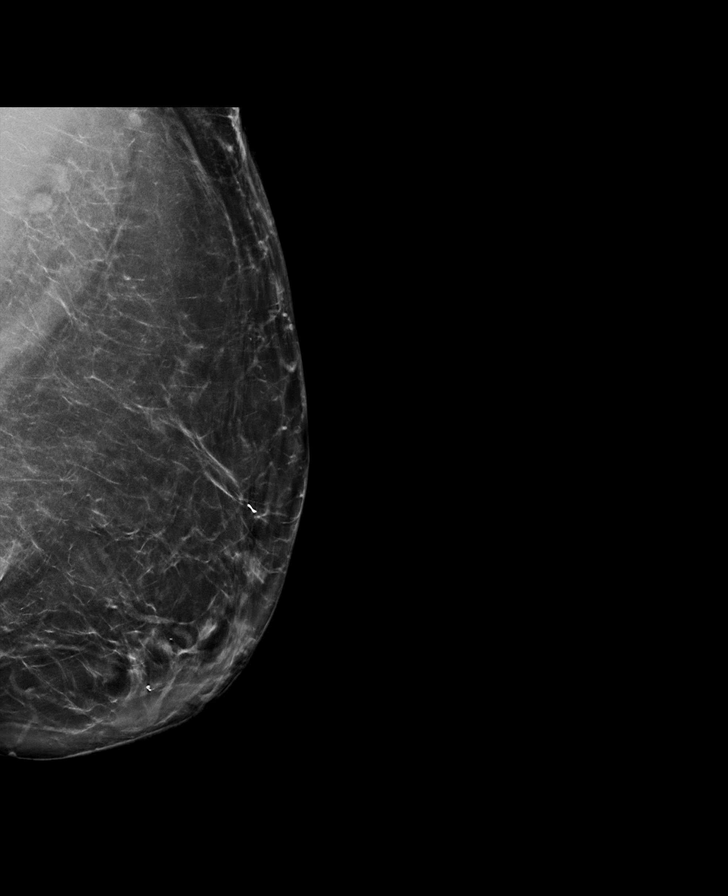

[L CC synth-2D]
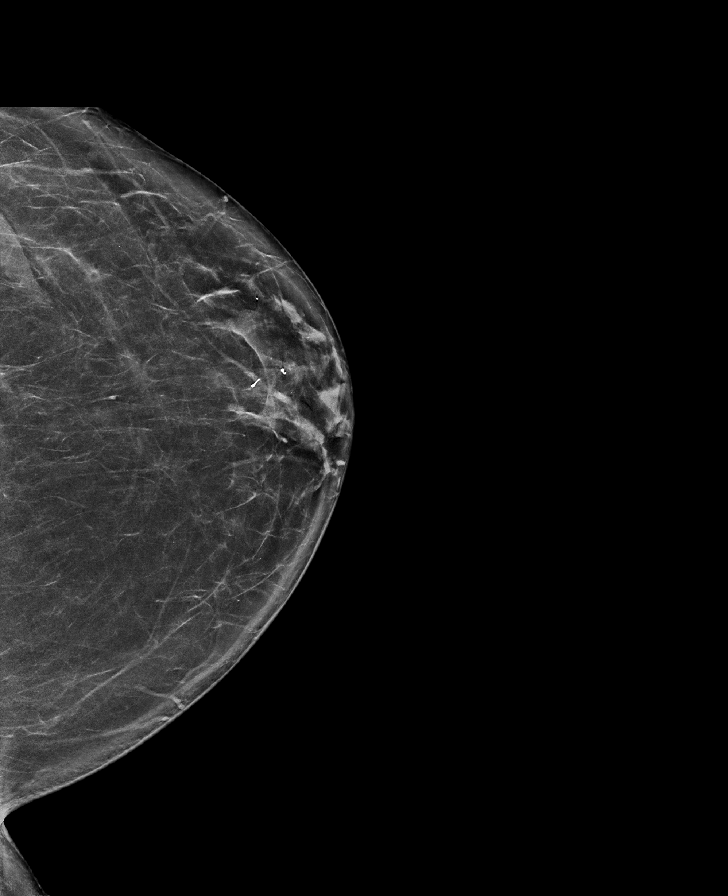

[R MLO synth-2D]
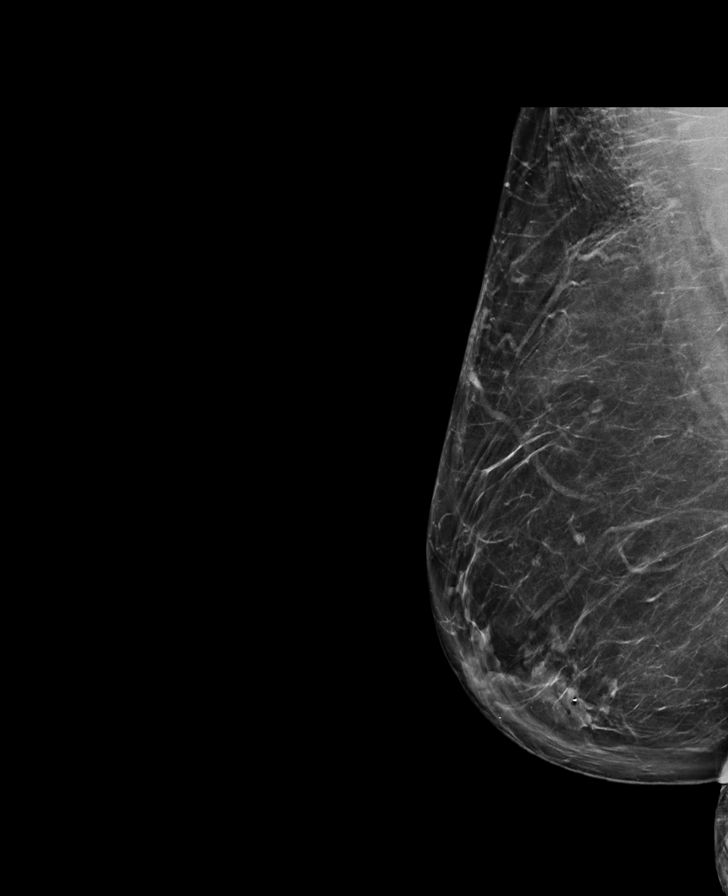

[R CC synth-2D]
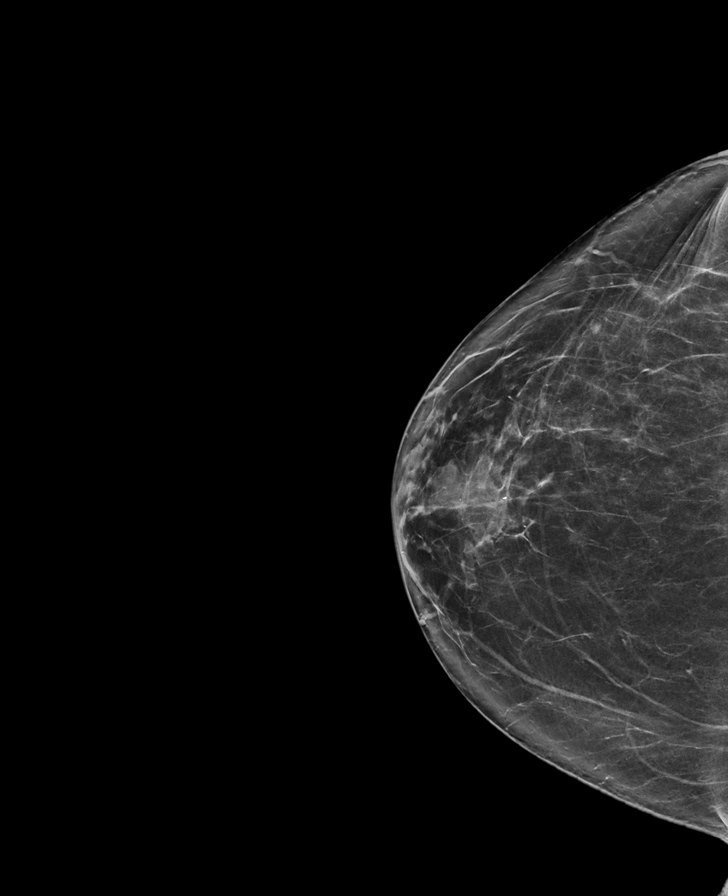

[R MLO tomo · tomo slice 40/79.0]
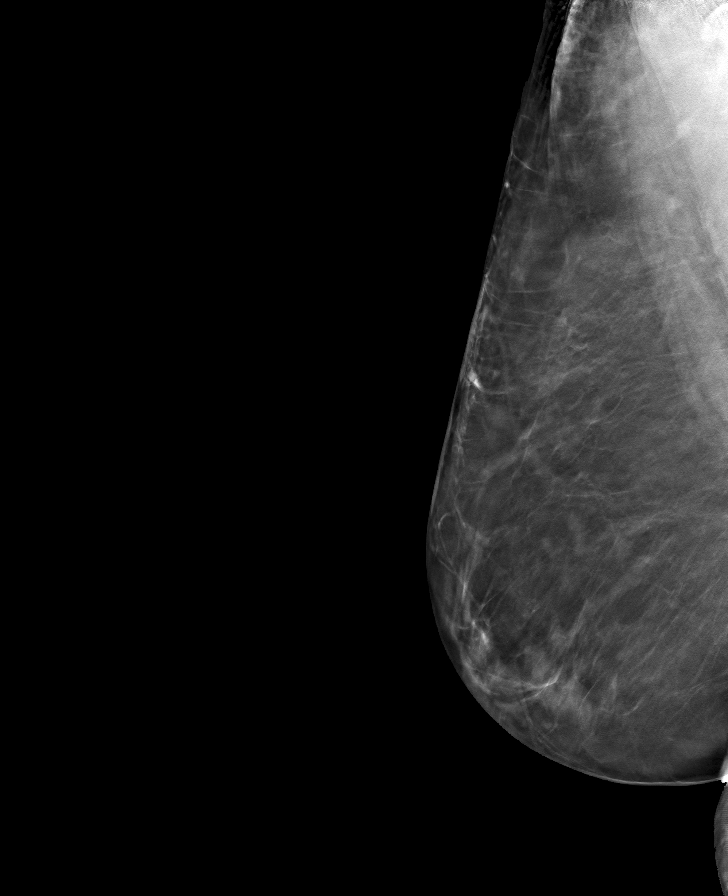

[L MLO tomo · tomo slice 44/87.0]
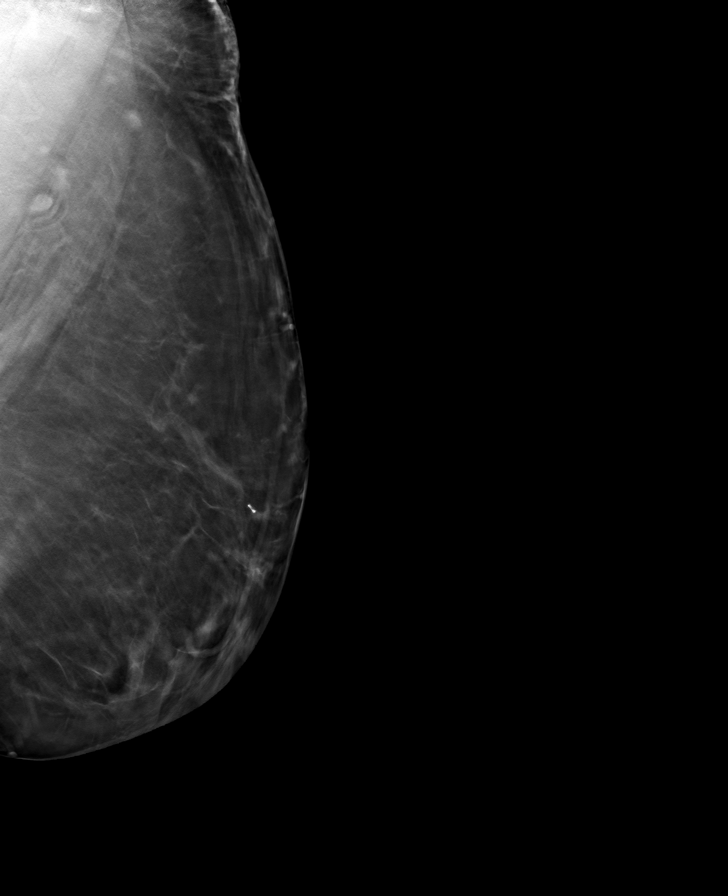

[R CC tomo · tomo slice 37/72.0]
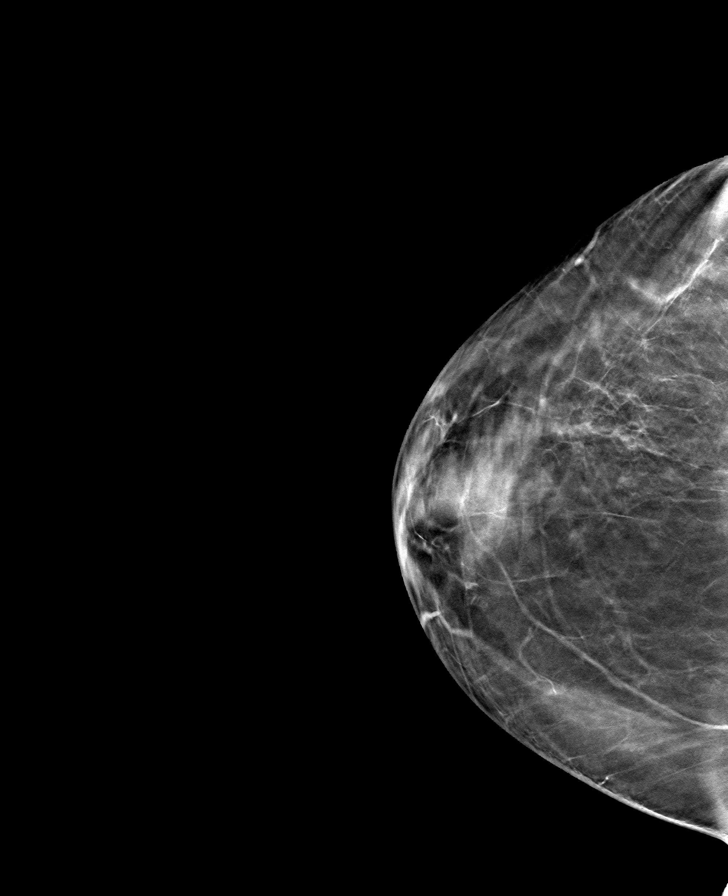

[L CC tomo · tomo slice 38/75.0]
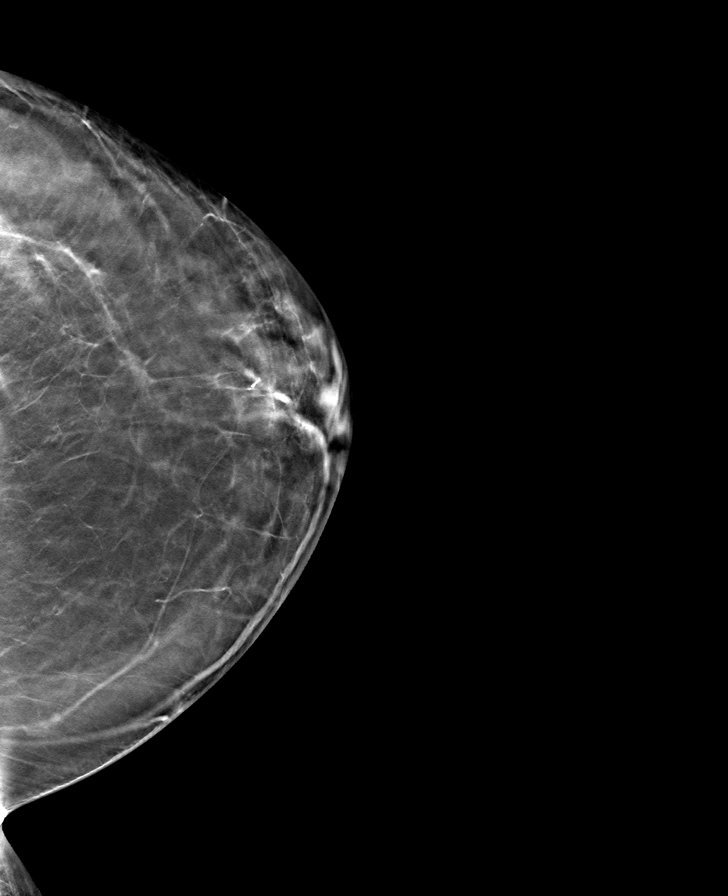

[8 of 24 positions shown; findings below may reference images not displayed]

ACR Breast Density Category b: There are scattered areas of
fibroglandular density.
FINDINGS: There are no findings suspicious for malignancy. The images were
evaluated with computer-aided detection.
IMPRESSION: No mammographic evidence of malignancy. A result letter of this
screening mammogram will be mailed directly to the patient.

RECOMMENDATION:
Screening mammogram in one year. (Code:C7-6-ASJ)

BI-RADS CATEGORY  1: Negative.

## 2023-01-05 ENCOUNTER — Ambulatory Visit
Admission: RE | Admit: 2023-01-05 | Discharge: 2023-01-05 | Disposition: A | Discharge status: Home / Self Care | Payer: Medicare HMO | Source: Ambulatory Visit | Attending: Family Medicine | Admitting: Family Medicine

## 2023-01-05 DIAGNOSIS — Z1231 Encounter for screening mammogram for malignant neoplasm of breast: Secondary | ICD-10-CM

## 2023-06-12 ENCOUNTER — Other Ambulatory Visit (HOSPITAL_COMMUNITY): Payer: Self-pay | Admitting: Family Medicine

## 2023-06-12 DIAGNOSIS — M79672 Pain in left foot: Secondary | ICD-10-CM

## 2024-01-02 ENCOUNTER — Emergency Department (HOSPITAL_COMMUNITY)
Admission: EM | Admit: 2024-01-02 | Discharge: 2024-01-02 | Disposition: A | Discharge status: Home / Self Care | Attending: Emergency Medicine | Admitting: Emergency Medicine

## 2024-01-02 ENCOUNTER — Other Ambulatory Visit: Payer: Self-pay

## 2024-01-02 ENCOUNTER — Emergency Department (HOSPITAL_COMMUNITY)

## 2024-01-02 ENCOUNTER — Encounter (HOSPITAL_COMMUNITY): Payer: Self-pay

## 2024-01-02 DIAGNOSIS — R112 Nausea with vomiting, unspecified: Secondary | ICD-10-CM | POA: Diagnosis present

## 2024-01-02 DIAGNOSIS — I10 Essential (primary) hypertension: Secondary | ICD-10-CM | POA: Insufficient documentation

## 2024-01-02 DIAGNOSIS — Z79899 Other long term (current) drug therapy: Secondary | ICD-10-CM | POA: Insufficient documentation

## 2024-01-02 DIAGNOSIS — E119 Type 2 diabetes mellitus without complications: Secondary | ICD-10-CM | POA: Insufficient documentation

## 2024-01-02 DIAGNOSIS — E860 Dehydration: Secondary | ICD-10-CM | POA: Insufficient documentation

## 2024-01-02 DIAGNOSIS — Z7984 Long term (current) use of oral hypoglycemic drugs: Secondary | ICD-10-CM | POA: Insufficient documentation

## 2024-01-02 DIAGNOSIS — I959 Hypotension, unspecified: Secondary | ICD-10-CM | POA: Insufficient documentation

## 2024-01-02 DIAGNOSIS — N3000 Acute cystitis without hematuria: Secondary | ICD-10-CM | POA: Insufficient documentation

## 2024-01-02 LAB — URINALYSIS, ROUTINE W REFLEX MICROSCOPIC
Bilirubin Urine: NEGATIVE
Glucose, UA: NEGATIVE mg/dL
Hgb urine dipstick: NEGATIVE
Ketones, ur: NEGATIVE mg/dL
Nitrite: NEGATIVE
Protein, ur: NEGATIVE mg/dL
Specific Gravity, Urine: 1.012 (ref 1.005–1.030)
WBC, UA: >50 WBC/hpf (ref 0–5)
pH: 7 (ref 5.0–8.0)

## 2024-01-02 LAB — CBC WITH DIFFERENTIAL/PLATELET
Abs Immature Granulocytes: 0.02 10*3/uL (ref 0.00–0.07)
Basophils Absolute: 0 10*3/uL (ref 0.0–0.1)
Basophils Relative: 0 %
Eosinophils Absolute: 0.1 10*3/uL (ref 0.0–0.5)
Eosinophils Relative: 1 %
HCT: 38.3 % (ref 36.0–46.0)
Hemoglobin: 12.1 g/dL (ref 12.0–15.0)
Immature Granulocytes: 0 %
Lymphocytes Relative: 16 %
Lymphs Abs: 1.5 10*3/uL (ref 0.7–4.0)
MCH: 26.9 pg (ref 26.0–34.0)
MCHC: 31.6 g/dL (ref 30.0–36.0)
MCV: 85.3 fL (ref 80.0–100.0)
Monocytes Absolute: 0.6 10*3/uL (ref 0.1–1.0)
Monocytes Relative: 6 %
Neutro Abs: 7.2 10*3/uL (ref 1.7–7.7)
Neutrophils Relative %: 77 %
Platelets: 266 10*3/uL (ref 150–400)
RBC: 4.49 MIL/uL (ref 3.87–5.11)
RDW: 12.6 % (ref 11.5–15.5)
WBC: 9.4 10*3/uL (ref 4.0–10.5)
nRBC: 0 % (ref 0.0–0.2)

## 2024-01-02 LAB — COMPREHENSIVE METABOLIC PANEL WITH GFR
ALT: 20 U/L (ref 0–44)
AST: 21 U/L (ref 15–41)
Albumin: 3.7 g/dL (ref 3.5–5.0)
Alkaline Phosphatase: 54 U/L (ref 38–126)
Anion gap: 11 (ref 5–15)
BUN: 19 mg/dL (ref 8–23)
CO2: 26 mmol/L (ref 22–32)
Calcium: 9.1 mg/dL (ref 8.9–10.3)
Chloride: 97 mmol/L — ABNORMAL LOW (ref 98–111)
Creatinine, Ser: 0.9 mg/dL (ref 0.44–1.00)
GFR, Estimated: >60 mL/min (ref >60)
Glucose, Bld: 109 mg/dL — ABNORMAL HIGH (ref 70–99)
Potassium: 4.1 mmol/L (ref 3.5–5.1)
Sodium: 134 mmol/L — ABNORMAL LOW (ref 135–145)
Total Bilirubin: 0.7 mg/dL (ref 0.0–1.2)
Total Protein: 7.1 g/dL (ref 6.5–8.1)

## 2024-01-02 LAB — LACTIC ACID, PLASMA: Lactic Acid, Venous: 1.8 mmol/L (ref 0.5–1.9)

## 2024-01-02 LAB — LIPASE, BLOOD: Lipase: 35 U/L (ref 11–51)

## 2024-01-02 MED ORDER — SODIUM CHLORIDE 0.9 % IV BOLUS
1000.0000 mL | Freq: Once | INTRAVENOUS | Status: AC
  Administered 2024-01-02: 1000 mL via INTRAVENOUS

## 2024-01-02 MED ORDER — IOHEXOL 300 MG/ML  SOLN
100.0000 mL | Freq: Once | INTRAMUSCULAR | Status: AC | PRN
  Administered 2024-01-02: 100 mL via INTRAVENOUS

## 2024-01-02 MED ORDER — SODIUM CHLORIDE 0.9 % IV SOLN
2.0000 g | Freq: Once | INTRAVENOUS | Status: AC
  Administered 2024-01-02: 2 g via INTRAVENOUS
  Filled 2024-01-02: qty 20

## 2024-01-02 MED ORDER — CEPHALEXIN 500 MG PO CAPS
500.0000 mg | ORAL_CAPSULE | Freq: Three times a day (TID) | ORAL | 0 refills | Status: AC
Start: 2024-01-02 — End: ?

## 2024-01-02 NOTE — ED Notes (Signed)
 Called to add urine culture

## 2024-01-02 NOTE — ED Provider Notes (Signed)
 [place] EMERGENCY DEPARTMENT AT [facility] Provider Note   CSN: [id number] Arrival date & time: 01/02/24  1045     Patient presents with: Hypotension   Catherine Levy is a 68 y.o. female.   Pt is a 68 yo female with pmhx significant for dm2, htn, arthritis, TBI, and mild intellectual disability.  Pt said she developed n/v this am.  She was at a group home this am and EMS was called.  Initial BP 86/48.  She was given 500 cc NS en route and BP has improved.  She said she feels well now.         Prior to Admission medications   Medication Sig Start Date End Date Taking? Authorizing Provider  cephALEXin (KEFLEX) 500 MG capsule Take 1 capsule (500 mg total) by mouth 3 (three) times daily. 01/02/24  Yes Anikin Prosser, MD  acetaminophen (TYLENOL) 325 MG tablet Take 650 mg by mouth every 6 (six) hours as needed.    [provider]  alum & mag hydroxide-simeth (MINTOX) 200-200-20 MG/5ML suspension Take by mouth every 6 (six) hours as needed for indigestion or heartburn.    [provider]  Ascorbic Acid (VITAMIN C) 1000 MG tablet Take 1,000 mg by mouth daily.    [provider]  atorvastatin (LIPITOR) 40 MG tablet Take 40 mg by mouth daily. 07/05/21   [provider]  bisacodyl (DULCOLAX) 10 MG suppository Place 10 mg rectally as needed for moderate constipation.    [provider]  brompheniramine-phenylephrine (DIMETAPP) 2-5 MG/10ML LIQD Take by mouth.    [provider]  calcium carbonate (OS-CAL) 600 MG TABS tablet Take 600 mg by mouth 2 (two) times daily with a meal.    [provider]  carbamide peroxide (DEBROX) 6.5 % OTIC solution 5 drops 2 (two) times daily.    [provider]  Cetirizine HCl (ZYRTEC ALLERGY) 10 MG CAPS Take 10 mg by mouth daily at 6 (six) AM.    [provider]  Diaper Rash Products (BALMEX MULTI-PURPOSE) OINT Apply topically.    [provider]  diazepam  (VALIUM) 5 MG tablet Take 5 mg by mouth every 6 (six) hours as needed for anxiety.    [provider]  diphenhydrAMINE (BENADRYL CHILDRENS ALLERGY) 12.5 MG/5ML liquid Take by mouth 4 (four) times daily as needed.    [provider]  gabapentin (NEURONTIN) 100 MG capsule Take 100 mg by mouth 3 (three) times daily. 07/05/21   [provider]  ibuprofen (ADVIL) 200 MG tablet Take 200 mg by mouth every 6 (six) hours as needed.    [provider]  lisinopril (ZESTRIL) 20 MG tablet Take 20 mg by mouth daily. 10/28/21   [provider]  loperamide (IMODIUM) 2 MG capsule Take 2 mg by mouth as needed for diarrhea or loose stools.    [provider]  magnesium hydroxide (MILK OF MAGNESIA) 400 MG/5ML suspension Take by mouth daily as needed for mild constipation.    [provider]  metFORMIN  (GLUCOPHAGE -XR) 500 MG 24 hr tablet Take 1 tablet (500 mg total) by mouth in the morning and at bedtime. 08/19/22   Shamleffer, Ibtehal Jaralla, MD  MYRBETRIQ 25 MG TB24 tablet Take 25 mg by mouth daily. 07/05/21   [provider]  neomycin-bacitracin-polymyxin (NEOSPORIN) [phone] ointment Apply topically 4 (four) times daily.    [provider]  Omega-3 1000 MG CAPS Take 1 capsule by mouth daily.    [provider]  Promethazine HCl (PHENERGAN PO) Take 25 mg by mouth.    [provider]  sertraline (ZOLOFT) 25 MG tablet Take 25 mg by mouth daily. 07/05/21   [provider]  Sunscreens (SUNSCREEN ULTRA SHEER) LOTN Apply topically.    [provider]  Vitamin D, Cholecalciferol, 25 MCG (1000 UT) TABS Take 2,000 Units by mouth daily.    [provider]  Vitamins A & D (VITAMIN A & D) ointment Apply 1 application. topically as needed for dry skin.    [provider]    Allergies: Patient has no known allergies.    Review of Systems  Gastrointestinal:  Positive for nausea and vomiting.   All other systems reviewed and are negative.   Updated Vital Signs BP 136/76   Pulse 70   Temp 97.9 F (36.6 C) (Oral)   Resp 12   SpO2 97%   Physical Exam Vitals and nursing note reviewed.  Constitutional:      Appearance: Normal appearance.  HENT:     Head: Normocephalic and atraumatic.     Right Ear: External ear normal.     Left Ear: External ear normal.     Nose: Nose normal.     Mouth/Throat:     Mouth: Mucous membranes are dry.   Eyes:     Extraocular Movements: Extraocular movements intact.     Conjunctiva/sclera: Conjunctivae normal.     Pupils: Pupils are equal, round, and reactive to light.    Cardiovascular:     Rate and Rhythm: Normal rate and regular rhythm.     Pulses: Normal pulses.     Heart sounds: Normal heart sounds.  Pulmonary:     Effort: Pulmonary effort is normal.     Breath sounds: Normal breath sounds.  Abdominal:     General: Abdomen is flat.     Palpations: Abdomen is soft.     Tenderness: There is abdominal tenderness in the left lower quadrant.   Musculoskeletal:        General: Normal range of motion.     Cervical back: Normal range of motion and neck supple.   Skin:    General: Skin is warm.     Capillary Refill: Capillary refill takes less than 2 seconds.   Neurological:     General: No focal deficit present.     Mental Status: She is alert and oriented to person, place, and time.   Psychiatric:        Mood and Affect: Mood normal.        Behavior: Behavior normal.     (all labs ordered are listed, but only abnormal results are displayed) Labs Reviewed  COMPREHENSIVE METABOLIC PANEL WITH GFR - Abnormal; Notable for the following components:      Result Value   Sodium 134 (*)    Chloride 97 (*)    Glucose, Bld 109 (*)    All other components within normal limits  URINALYSIS, ROUTINE W REFLEX MICROSCOPIC - Abnormal; Notable for the following components:   APPearance CLOUDY (*)    Leukocytes,Ua LARGE (*)     Bacteria, UA MANY (*)    All other components within normal limits  URINE CULTURE  CBC WITH DIFFERENTIAL/PLATELET  LIPASE, BLOOD  LACTIC ACID, PLASMA  LACTIC ACID, PLASMA    EKG: None  Radiology: CT ABDOMEN PELVIS W CONTRAST Result Date: 01/02/2024 CLINICAL DATA:  Hypotension, abdominal pain, nausea, and vomiting EXAM: CT ABDOMEN AND PELVIS WITH CONTRAST TECHNIQUE: Multidetector CT imaging of the  abdomen and pelvis was performed using the standard protocol following bolus administration of intravenous contrast. RADIATION DOSE REDUCTION: This exam was performed according to the departmental dose-optimization program which includes automated exposure control, adjustment of the mA and/or kV according to patient size and/or use of iterative reconstruction technique. CONTRAST:  100mL OMNIPAQUE IOHEXOL 300 MG/ML  SOLN COMPARISON:  None Available. FINDINGS: Lower chest: No focal consolidation or pulmonary nodule in the lung bases. No pleural effusion or pneumothorax demonstrated. Partially imaged heart size is normal. Hepatobiliary: No focal hepatic lesions. No intra or extrahepatic biliary ductal dilation. Normal gallbladder. Pancreas: No focal lesions or main ductal dilation. Spleen: Normal in size without focal abnormality. Adrenals/Urinary Tract: No adrenal nodules. No suspicious renal mass, calculi or hydronephrosis. 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 Mild mural thickening of the posterior urinary bladder. Stomach/Bowel: Normal appearance of the stomach. No evidence of bowel wall thickening, distention, or inflammatory changes. Fluid density within the ascending and proximal transverse colon. Moderate volume stool within the descending and rectosigmoid colon. Normal appendix. Vascular/Lymphatic: Aortic atherosclerosis. No enlarged abdominal or pelvic lymph nodes. Reproductive: No adnexal masses. Other: No free fluid, fluid  collection, or free air. Asymmetric elevation of the right hemidiaphragm. Musculoskeletal: No acute or abnormal lytic or blastic osseous lesions. Multilevel degenerative changes of the partially imaged thoracic and lumbar spine. Degenerative changes within the thoracic spine appear severe with posterior disc osteophyte complexes resulting in multilevel effacement of the spinal canal. IMPRESSION: 1. Mild mural thickening of the posterior urinary bladder, which may be related cystitis. Recommend correlation with urinalysis. 2. Intraluminal fluid density within the ascending and proximal transverse colon may reflect diarrheal illness. Findings of constipation with moderate volume stool within the descending and rectosigmoid colon. 3. Multilevel degenerative changes of the partially imaged thoracic and lumbar spine. Degenerative changes within the thoracic spine appear severe with posterior disc osteophyte complexes resulting in multilevel effacement of the spinal canal. 4.  Aortic Atherosclerosis (ICD10-I70.0). Electronically Signed   By: Limin  Xu M.D.   On: 01/02/2024 13:43   DG Chest Portable 1 View Result Date: 01/02/2024 CLINICAL DATA:  Hypotension EXAM: PORTABLE CHEST 1 VIEW COMPARISON:  None Available. FINDINGS: Low lung volumes with bronchovascular crowding. No focal consolidations. No pleural effusion or pneumothorax. The heart size and mediastinal contours are within normal limits. No acute osseous abnormality. IMPRESSION: Low lung volumes with bronchovascular crowding. No focal consolidations. Electronically Signed   By: Limin  Xu M.D.   On: 01/02/2024 13:30     Procedures   Medications Ordered in the ED  sodium chloride 0.9 % bolus 1,000 mL (0 mLs Intravenous Stopped 01/02/24 1410)  iohexol (OMNIPAQUE) 300 MG/ML solution 100 mL (100 mLs Intravenous Contrast Given 01/02/24 1237)  cefTRIAXone (ROCEPHIN) 2 g in sodium chloride 0.9 % 100 mL IVPB (0 g Intravenous Stopped 01/02/24 1442)                                     Medical Decision Making Amount and/or Complexity of Data Reviewed Labs: ordered. Radiology: ordered.  Risk Prescription drug management.   This patient presents to the ED for concern of hypotension and n/v, this involves an extensive number of treatment options, and is a complaint that carries with it a high risk of complications and morbidity.  The differential diagnosis includes gastroenteritis, infection, sepsis   Co morbidities that  complicate the patient evaluation  dm2, htn, arthritis, TBI, and mild intellectual disability   Additional history obtained:  Additional history obtained from epic chart review External records from outside source obtained and reviewed including EMS report   Lab Tests:  I Ordered, and personally interpreted labs.  The pertinent results include:  cbc nl, cmp nl, lactic nl, ua + for uti   Imaging Studies ordered:  I ordered imaging studies including cxr, ct abd/pelvis  I independently visualized and interpreted imaging which showed  CXR: Low lung volumes with bronchovascular crowding. No focal  consolidations.  CT abd/pelvis:  Mild mural thickening of the posterior urinary bladder, which may  be related cystitis. Recommend correlation with urinalysis.  2. Intraluminal fluid density within the ascending and proximal  transverse colon may reflect diarrheal illness. Findings of  constipation with moderate volume stool within the descending and  rectosigmoid colon.  3. Multilevel degenerative changes of the partially imaged thoracic  and lumbar spine. Degenerative changes within the thoracic spine  appear severe with posterior disc osteophyte complexes resulting in  multilevel effacement of the spinal canal.  4.  Aortic Atherosclerosis (ICD10-I70.0).   I agree with the radiologist interpretation   Cardiac Monitoring:  The patient was maintained on a cardiac monitor.  I personally viewed and interpreted the cardiac  monitored which showed an underlying rhythm of: nsr   Medicines ordered and prescription drug management:  I ordered medication including ivfs/rocephin  for sx  Reevaluation of the patient after these medicines showed that the patient improved I have reviewed the patients home medicines and have made adjustments as needed   Test Considered:  ct   Critical Interventions:  Ivfs/abx   Problem List / ED Course:  Hypotension:  possibly due to vasovagal with n/v or with dehydration.  BP has been fine here.  She does not appear to be septic.  Orthostatics are normal after fluids and abx.  UTI:  pt given rocephin in ED and will be d/c with keflex.  Pt is to return if worse.  F/u with pcp.  Group home worker with pt at d/c.   Reevaluation:  After the interventions noted above, I reevaluated the patient and found that they have :improved   Social Determinants of Health:  Lives in a group home   Dispostion:  After consideration of the diagnostic results and the patients response to treatment, I feel that the patent would benefit from discharge with outpatient f/u.       Final diagnoses:  Dehydration  Acute cystitis without hematuria    ED Discharge Orders          Ordered    cephALEXin (KEFLEX) 500 MG capsule  3 times daily        01/02/24 1456               Sueellen Emery, MD 01/02/24 1459

## 2024-01-02 NOTE — ED Triage Notes (Signed)
 Pt BIB EMS from [facility] due to hypotension and n/v. Initial BP 86/48 manual. Pt given 500 mL bolus IV NaCl; BP after was 108/60 manual. Pt AAOx4.  HR 64 RR 18 CBG 136 20 ga IV RAC Hx of TBI

## 2024-01-04 LAB — URINE CULTURE: Culture: >=100000 — AB

## 2024-01-05 ENCOUNTER — Telehealth (HOSPITAL_BASED_OUTPATIENT_CLINIC_OR_DEPARTMENT_OTHER): Payer: Self-pay | Admitting: *Deleted

## 2024-01-05 NOTE — Progress Notes (Signed)
 ED Antimicrobial Stewardship Positive Culture Follow Up   Catherine Levy is an 68 y.o. female who presented to [facility] on 01/02/2024 with a chief complaint of  Chief Complaint  Patient presents with   Hypotension    Recent Results (from the past 720 hours)  Urine Culture     Status: Abnormal   Collection Time: 01/02/24  1:02 PM   Specimen: Urine, Clean Catch  Result Value Ref Range Status   Specimen Description   Final    URINE, CLEAN CATCH Performed at [facility], [address] [place], [place] [address]    Special Requests   Final    NONE Performed at [facility], [address], [place], [place] [address]    Culture >=100,000 COLONIES/mL [facility] MORGANII (A)  Final   Report Status 01/04/2024 FINAL  Final   Organism ID, Bacteria MORGANELLA MORGANII (A)  Final      Susceptibility   Morganella morganii - MIC*    AMPICILLIN >=32 RESISTANT Resistant     CIPROFLOXACIN <=0.25 SENSITIVE Sensitive     GENTAMICIN <=1 SENSITIVE Sensitive     IMIPENEM 8 INTERMEDIATE Intermediate     NITROFURANTOIN RESISTANT Resistant     TRIMETH/SULFA <=20 SENSITIVE Sensitive     AMPICILLIN/SULBACTAM 16 INTERMEDIATE Intermediate     PIP/TAZO <=4 SENSITIVE Sensitive ug/mL    * >=100,000 COLONIES/mL MORGANELLA MORGANII    [x]  Treated with cephalexin, organism resistant to prescribed antimicrobial []  Patient discharged originally without antimicrobial agent and treatment is now indicated  New antibiotic prescription: Stop cephalexin. Start Bactrim DS 1 tablet BID X 3 days   ED Provider: Angela Kell, MD   Denson Flake, PharmD, BCPS, BCIDP Infectious Diseases Clinical Pharmacist Phone: [phone] 01/05/2024, 10:57 AM

## 2024-01-05 NOTE — Telephone Encounter (Signed)
 Post ED Visit - Positive Culture Follow-up: Unsuccessful Patient Follow-up  Culture assessed and recommendations reviewed by:  [x] Denson Flake Pharm.D. []  Skeet Duke, Pharm.D., BCPS AQ-ID []  Leslee Rase, Pharm.D., BCPS []  Garland Junk, Pharm.D., BCPS []  [place], [address].D., BCPS, AAHIVP []  Alcide Aly, Pharm.D., BCPS, AAHIVP []  Alejo Hurter, PharmD []  Thomasine Flick, PharmD, BCPS  Positive urine culture  []  Patient discharged without antimicrobial prescription and treatment is now indicated [x]  Organism is resistant to prescribed ED discharge antimicrobial []  Patient with positive blood cultures  Patient at a group home- was transferred to staff RN and two different voice mails by the secretary Enid Harry. Number to office left on voicemail, Unable to contact patient after 3 attempts, letter will be sent to address on file. Plan: stop Keflex and start Bactrim DS 1 tab BID x 3 days. Authorize by Dr Angela Kell.   Lovell Rubenstein Sharion Davidson 01/05/2024, 12:15 PM

## 2024-02-16 ENCOUNTER — Other Ambulatory Visit: Payer: Self-pay | Admitting: Family Medicine

## 2024-02-16 DIAGNOSIS — Z1231 Encounter for screening mammogram for malignant neoplasm of breast: Secondary | ICD-10-CM

## 2024-03-08 ENCOUNTER — Ambulatory Visit
Admission: RE | Admit: 2024-03-08 | Discharge: 2024-03-08 | Disposition: A | Discharge status: Home / Self Care | Source: Ambulatory Visit | Attending: Family Medicine | Admitting: Family Medicine

## 2024-03-08 DIAGNOSIS — Z1231 Encounter for screening mammogram for malignant neoplasm of breast: Secondary | ICD-10-CM

## 2024-06-12 ENCOUNTER — Emergency Department (HOSPITAL_COMMUNITY)
Admission: EM | Admit: 2024-06-12 | Discharge: 2024-06-12 | Disposition: A | Discharge status: Home / Self Care | Attending: Emergency Medicine | Admitting: Emergency Medicine

## 2024-06-12 ENCOUNTER — Other Ambulatory Visit: Payer: Self-pay

## 2024-06-12 DIAGNOSIS — I10 Essential (primary) hypertension: Secondary | ICD-10-CM | POA: Diagnosis not present

## 2024-06-12 DIAGNOSIS — E119 Type 2 diabetes mellitus without complications: Secondary | ICD-10-CM | POA: Insufficient documentation

## 2024-06-12 DIAGNOSIS — R55 Syncope and collapse: Secondary | ICD-10-CM | POA: Insufficient documentation

## 2024-06-12 DIAGNOSIS — Z79899 Other long term (current) drug therapy: Secondary | ICD-10-CM | POA: Diagnosis not present

## 2024-06-12 DIAGNOSIS — F79 Unspecified intellectual disabilities: Secondary | ICD-10-CM | POA: Diagnosis not present

## 2024-06-12 LAB — CBC WITH DIFFERENTIAL/PLATELET
Abs Immature Granulocytes: 0.02 K/uL (ref 0.00–0.07)
Basophils Absolute: 0 K/uL (ref 0.0–0.1)
Basophils Relative: 1 %
Eosinophils Absolute: 0.1 K/uL (ref 0.0–0.5)
Eosinophils Relative: 1 %
HCT: 41.3 % (ref 36.0–46.0)
Hemoglobin: 12.9 g/dL (ref 12.0–15.0)
Immature Granulocytes: 0 %
Lymphocytes Relative: 16 %
Lymphs Abs: 1.3 K/uL (ref 0.7–4.0)
MCH: 26.7 pg (ref 26.0–34.0)
MCHC: 31.2 g/dL (ref 30.0–36.0)
MCV: 85.5 fL (ref 80.0–100.0)
Monocytes Absolute: 0.6 K/uL (ref 0.1–1.0)
Monocytes Relative: 8 %
Neutro Abs: 5.9 K/uL (ref 1.7–7.7)
Neutrophils Relative %: 74 %
Platelets: 288 K/uL (ref 150–400)
RBC: 4.83 MIL/uL (ref 3.87–5.11)
RDW: 13.1 % (ref 11.5–15.5)
WBC: 7.9 K/uL (ref 4.0–10.5)
nRBC: 0 % (ref 0.0–0.2)

## 2024-06-12 LAB — COMPREHENSIVE METABOLIC PANEL WITH GFR
ALT: 14 U/L (ref 0–44)
AST: 19 U/L (ref 15–41)
Albumin: 4 g/dL (ref 3.5–5.0)
Alkaline Phosphatase: 67 U/L (ref 38–126)
Anion gap: 11 (ref 5–15)
BUN: 22 mg/dL (ref 8–23)
CO2: 27 mmol/L (ref 22–32)
Calcium: 9.3 mg/dL (ref 8.9–10.3)
Chloride: 100 mmol/L (ref 98–111)
Creatinine, Ser: 0.85 mg/dL (ref 0.44–1.00)
GFR, Estimated: >60 mL/min (ref >60)
Glucose, Bld: 137 mg/dL — ABNORMAL HIGH (ref 70–99)
Potassium: 4.2 mmol/L (ref 3.5–5.1)
Sodium: 138 mmol/L (ref 135–145)
Total Bilirubin: 0.3 mg/dL (ref 0.0–1.2)
Total Protein: 7.1 g/dL (ref 6.5–8.1)

## 2024-06-12 LAB — CBG MONITORING, ED: Glucose-Capillary: 132 mg/dL — ABNORMAL HIGH (ref 70–99)

## 2024-06-12 NOTE — ED Provider Notes (Signed)
 Catherine Levy Provider Note   CSN: [id number] Arrival date & time: 06/12/24  1404     Patient presents with: Loss of Consciousness   Catherine Levy is a 68 y.o. female.   Patient is a 68 year old female with a history of diabetes, hypertension, traumatic brain injury and intellectual disability who is presenting today after an episode of emesis and syncope.  Patient reports she had been feeling fine all day.  She had lunch and breakfast and was feeling good but she was on the [place] and they had stopped to go in to [place] and she was sitting in the [place] when she started feeling very hot and sweaty.  She then became nauseated and had an episode of emesis at which time she had a brief moment of loss of consciousness.  The person with her said that she hung her head down and they were calling her name but it took several seconds before she would respond.  Currently the patient reports she feels great.  She has had no further nausea or vomiting.  She has had no recent medication changes.  She did not fall or injure herself during this event.  She denies any chest pain, shortness of breath or palpitations.  She denies any abdominal pain.  The history is provided by the patient, medical records and a caregiver.  Loss of Consciousness      Prior to Admission medications   Medication Sig Start Date End Date Taking? Authorizing Provider  acetaminophen (TYLENOL) 325 MG tablet Take 650 mg by mouth every 6 (six) hours as needed.    [provider]  alum & mag hydroxide-simeth (MINTOX) 200-200-20 MG/5ML suspension Take by mouth every 6 (six) hours as needed for indigestion or heartburn.    [provider]  Ascorbic Acid (VITAMIN C) 1000 MG tablet Take 1,000 mg by mouth daily.    [provider]  atorvastatin (LIPITOR) 40 MG tablet Take 40 mg by mouth daily. 07/05/21   [provider]  bisacodyl (DULCOLAX) 10 MG  suppository Place 10 mg rectally as needed for moderate constipation.    [provider]  brompheniramine-phenylephrine (DIMETAPP) 2-5 MG/10ML LIQD Take by mouth.    [provider]  calcium carbonate (OS-CAL) 600 MG TABS tablet Take 600 mg by mouth 2 (two) times daily with a meal.    [provider]  carbamide peroxide (DEBROX) 6.5 % OTIC solution 5 drops 2 (two) times daily.    [provider]  cephALEXin  (KEFLEX ) 500 MG capsule Take 1 capsule (500 mg total) by mouth 3 (three) times daily. 01/02/24   Haviland, Julie, MD  Cetirizine HCl (ZYRTEC ALLERGY) 10 MG CAPS Take 10 mg by mouth daily at 6 (six) AM.    [provider]  Diaper Rash Products (BALMEX MULTI-PURPOSE) OINT Apply topically.    [provider]  diazepam (VALIUM) 5 MG tablet Take 5 mg by mouth every 6 (six) hours as needed for anxiety.    [provider]  diphenhydrAMINE (BENADRYL CHILDRENS ALLERGY) 12.5 MG/5ML liquid Take by mouth 4 (four) times daily as needed.    [provider]  gabapentin (NEURONTIN) 100 MG capsule Take 100 mg by mouth 3 (three) times daily. 07/05/21   [provider]  ibuprofen (ADVIL) 200 MG tablet Take 200 mg by mouth every 6 (six) hours as needed.    [provider]  lisinopril (ZESTRIL) 20 MG tablet Take 20 mg by mouth daily. 10/28/21  [provider]  loperamide (IMODIUM) 2 MG capsule Take 2 mg by mouth as needed for diarrhea or loose stools.    [provider]  magnesium hydroxide (MILK OF MAGNESIA) 400 MG/5ML suspension Take by mouth daily as needed for mild constipation.    [provider]  metFORMIN  (GLUCOPHAGE -XR) 500 MG 24 hr tablet Take 1 tablet (500 mg total) by mouth in the morning and at bedtime. 08/19/22   Shamleffer, Ibtehal Jaralla, MD  MYRBETRIQ 25 MG TB24 tablet Take 25 mg by mouth daily. 07/05/21   [provider]  neomycin-bacitracin-polymyxin (NEOSPORIN) [phone]  ointment Apply topically 4 (four) times daily.    [provider]  Omega-3 1000 MG CAPS Take 1 capsule by mouth daily.    [provider]  Promethazine HCl (PHENERGAN PO) Take 25 mg by mouth.    [provider]  sertraline (ZOLOFT) 25 MG tablet Take 25 mg by mouth daily. 07/05/21   [provider]  Sunscreens (SUNSCREEN ULTRA SHEER) LOTN Apply topically.    [provider]  Vitamin D, Cholecalciferol, 25 MCG (1000 UT) TABS Take 2,000 Units by mouth daily.    [provider]  Vitamins A & D (VITAMIN A & D) ointment Apply 1 application. topically as needed for dry skin.    [provider]    Allergies: Patient has no known allergies.    Review of Systems  Cardiovascular:  Positive for syncope.    Updated Vital Signs BP 110/72   Pulse 76   Temp 98 F (36.7 C) (Oral)   Resp 16   SpO2 95%   Physical Exam Vitals and nursing note reviewed.  Constitutional:      General: She is not in acute distress.    Appearance: She is well-developed.  HENT:     Head: Normocephalic and atraumatic.  Eyes:     Pupils: Pupils are equal, round, and reactive to light.  Cardiovascular:     Rate and Rhythm: Normal rate and regular rhythm.     Pulses: Normal pulses.     Heart sounds: Normal heart sounds. No murmur heard.    No friction rub.  Pulmonary:     Effort: Pulmonary effort is normal.     Breath sounds: Normal breath sounds. No wheezing or rales.  Abdominal:     General: Bowel sounds are normal. There is no distension.     Palpations: Abdomen is soft.     Tenderness: There is no abdominal tenderness. There is no guarding or rebound.  Musculoskeletal:        General: No tenderness. Normal range of motion.     Right lower leg: No edema.     Left lower leg: No edema.     Comments: No edema  Skin:    General: Skin is warm and dry.     Findings: No rash.  Neurological:     Mental Status: She is alert and oriented to person,  place, and time. Mental status is at baseline.     Cranial Nerves: No cranial nerve deficit.  Psychiatric:        Behavior: Behavior normal.     (all labs ordered are listed, but only abnormal results are displayed) Labs Reviewed  COMPREHENSIVE METABOLIC PANEL WITH GFR - Abnormal; Notable for the following components:      Result Value   Glucose, Bld 137 (*)    All other components within normal limits  CBG MONITORING, ED - Abnormal; Notable for the following  components:   Glucose-Capillary 132 (*)    All other components within normal limits  CBC WITH DIFFERENTIAL/PLATELET    EKG: EKG Interpretation Date/Time:  Wednesday June 12 2024 14:12:31 EST Ventricular Rate:  72 PR Interval:  233 QRS Duration:  91 QT Interval:  401 QTC Calculation: 439 R Axis:   -29  Text Interpretation: Sinus rhythm Prolonged PR interval Probable anterior infarct, age indeterminate Baseline wander in lead(s) III aVL No previous tracing Confirmed by Doretha Folks ([id number]) on 06/12/2024 5:05:39 PM  Radiology: No results found.   Procedures   Medications Ordered in the ED - No data to display                                  Medical Decision Making Amount and/or Complexity of Data Reviewed Labs: ordered. Decision-making details documented in ED Course. ECG/medicine tests: ordered and independent interpretation performed. Decision-making details documented in ED Course.   Pt with multiple medical problems and comorbidities and presenting today with a complaint that caries a high risk for morbidity and mortality.  Here today after an episode of syncope.  She did have prodromal symptoms and it was brief after an episode of emesis.  Suspect most likely vasovagal event.  Patient denies any symptoms at this time and was feeling fine earlier today.  No recent medication changes.  I independently interpreted patient's EKG and labs.  EKG showed some nonspecific T wave changes however when looking at  another medical system where she has had EKGs before it seems that that is chronic changes.  CBC, CMP and blood sugar without acute findings today.  At this time that patient is safe for discharge.  Discussed this with the patient and the caregiver with her.  They are comfortable with this plan.      Final diagnoses:  Vasovagal syncope    ED Discharge Orders     None          Doretha Folks, MD 06/12/24 1744

## 2024-06-12 NOTE — ED Triage Notes (Signed)
 Pt arrives via EMS from the [facility] parking lot. PT reportedly had a syncopal episode while seated in a wheelchair transport van. PT thinks that she over-heated, and that caused the event.   No fall or trauma per EMS.

## 2024-06-12 NOTE — Discharge Instructions (Addendum)
 Return to the emergency room if you develop chest pain, shortness of breath, recurrent vomiting and recurrent passing out.  The lab work and EKG today looked okay.  Continue all your current medications.  Your blood sugar was good.

## 2024-09-02 ENCOUNTER — Ambulatory Visit: Admitting: Obstetrics and Gynecology
